# Patient Record
Sex: Male | Born: 1970 | Race: Black or African American | Hispanic: No | Marital: Married | Smoking: Never smoker
Health system: Southern US, Community
[De-identification: ages and names within clinical notes are randomized; demographics above are authoritative.]

## PROBLEM LIST (undated history)

## (undated) DIAGNOSIS — I10 Essential (primary) hypertension: Secondary | ICD-10-CM

---

## 2015-10-25 ENCOUNTER — Ambulatory Visit (INDEPENDENT_AMBULATORY_CARE_PROVIDER_SITE_OTHER): Payer: BLUE CROSS/BLUE SHIELD

## 2015-10-25 ENCOUNTER — Ambulatory Visit
Admission: EM | Admit: 2015-10-25 | Discharge: 2015-10-25 | Disposition: A | Discharge status: Home / Self Care | Payer: BLUE CROSS/BLUE SHIELD | Attending: Family Medicine | Admitting: Family Medicine

## 2015-10-25 DIAGNOSIS — T148XXA Other injury of unspecified body region, initial encounter: Secondary | ICD-10-CM

## 2015-10-25 DIAGNOSIS — T148 Other injury of unspecified body region: Secondary | ICD-10-CM

## 2015-10-25 DIAGNOSIS — S40012A Contusion of left shoulder, initial encounter: Secondary | ICD-10-CM

## 2015-10-25 HISTORY — DX: Essential (primary) hypertension: I10

## 2015-10-25 MED ORDER — KETOROLAC TROMETHAMINE 60 MG/2ML IM SOLN
60.0000 mg | Freq: Once | INTRAMUSCULAR | Status: AC
  Administered 2015-10-25: 60 mg via INTRAMUSCULAR

## 2015-10-25 MED ORDER — IBUPROFEN 800 MG PO TABS: 800.0000 mg | ORAL_TABLET | Freq: Three times a day (TID) | ORAL | Status: DC | PRN

## 2015-10-25 MED ORDER — CYCLOBENZAPRINE HCL 10 MG PO TABS: 10.0000 mg | ORAL_TABLET | Freq: Three times a day (TID) | ORAL | Status: DC | PRN
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
## 2015-11-15 ENCOUNTER — Encounter: Payer: Self-pay | Admitting: Emergency Medicine

## 2015-11-15 ENCOUNTER — Ambulatory Visit
Admission: EM | Admit: 2015-11-15 | Discharge: 2015-11-15 | Disposition: A | Discharge status: Home / Self Care | Payer: BLUE CROSS/BLUE SHIELD | Attending: Family Medicine | Admitting: Family Medicine

## 2015-11-15 DIAGNOSIS — S161XXA Strain of muscle, fascia and tendon at neck level, initial encounter: Secondary | ICD-10-CM

## 2015-11-15 MED ORDER — CYCLOBENZAPRINE HCL 10 MG PO TABS: 10.0000 mg | ORAL_TABLET | Freq: Every day | ORAL | Status: DC

## 2015-11-15 MED ORDER — IBUPROFEN 800 MG PO TABS: 800.0000 mg | ORAL_TABLET | Freq: Three times a day (TID) | ORAL | Status: DC

## 2015-11-15 NOTE — ED Provider Notes (Signed)
CSN: [id number]     Arrival date & time 11/15/15  1919 History   None    Chief Complaint  Patient presents with  . Neck Pain  . Back Pain   (Consider location/radiation/quality/duration/timing/severity/associated sxs/prior Treatment) HPI Comments: 45 yo male with a c/o right sided neck/upper back pain that started today (this afternoon). States was feeling fine recently and denies any injuries, falls, trauma, fevers, chills, numbness/tingling.  Patient is a 45 y.o. male presenting with neck pain and back pain. The history is provided by the patient.  Neck Pain Pain location:  R side Back Pain   Past Medical History  Diagnosis Date  . Hypertension    History reviewed. No pertinent past surgical history. Family History  Problem Relation Age of Onset  . Cancer Father    Social History  Substance Use Topics  . Smoking status: Never Smoker   . Smokeless tobacco: None  . Alcohol Use: Yes     Comment: socially    Review of Systems  Musculoskeletal: Positive for back pain and neck pain.    Allergies  Review of patient's allergies indicates no known allergies.  Home Medications   Prior to Admission medications   Medication Sig Start Date End Date Taking? Authorizing Provider  cyclobenzaprine (FLEXERIL) 10 MG tablet Take 1 tablet (10 mg total) by mouth at bedtime. 11/15/15   Payton Mccallum, MD  ibuprofen (ADVIL,MOTRIN) 800 MG tablet Take 1 tablet (800 mg total) by mouth 3 (three) times daily. 11/15/15   Payton Mccallum, MD   Meds Ordered and Administered this Visit  Medications - No data to display  BP 143/95 mmHg  Pulse 73  Temp(Src) 98.1 F (36.7 C) (Oral)  Resp 16  Ht  (1.702 m)  Wt 198 lb (89.812 kg)  BMI 31.00 kg/m2  SpO2 100% No data found.   Physical Exam  Constitutional: He appears well-developed and well-nourished. No distress.  Musculoskeletal: He exhibits no edema.       Cervical back: He exhibits tenderness and spasm. He exhibits normal range of  motion, no bony tenderness, no swelling, no edema, no deformity, no laceration and normal pulse.       Back:  Tenderness to palpation over the right trapezius muscle  Skin: He is not diaphoretic.  Nursing note and vitals reviewed.   ED Course  Procedures (including critical care time)  Labs Review Labs Reviewed - No data to display  Imaging Review No results found.   Visual Acuity Review  Right Eye Distance:   Left Eye Distance:   Bilateral Distance:    Right Eye Near:   Left Eye Near:    Bilateral Near:         MDM   1. Neck strain, initial encounter    Discharge Medication List as of 11/15/2015  8:27 PM     1. diagnosis reviewed with patient 2. rx as per orders above; reviewed possible side effects, interactions, risks and benefits; rx given for flexeril and ibuprofen as per orders 3. Recommend supportive treatment with heat, gentle range of motion exercises  4. Follow-up prn if symptoms worsen or don't improve    Payton Mccallum, MD 11/15/15 2115

## 2015-11-15 NOTE — ED Notes (Signed)
Patient c/o ongoing right sided neck and upper back pain that has gotten worse today.  Patient denies N/V.  Patient denies fevers.

## 2015-12-18 ENCOUNTER — Other Ambulatory Visit: Payer: Self-pay | Admitting: Family Medicine

## 2015-12-18 DIAGNOSIS — N5089 Other specified disorders of the male genital organs: Secondary | ICD-10-CM

## 2015-12-24 ENCOUNTER — Ambulatory Visit
Admission: RE | Admit: 2015-12-24 | Discharge: 2015-12-24 | Disposition: A | Discharge status: Home / Self Care | Payer: BLUE CROSS/BLUE SHIELD | Source: Ambulatory Visit | Attending: Family Medicine | Admitting: Family Medicine

## 2015-12-24 ENCOUNTER — Ambulatory Visit: Payer: BLUE CROSS/BLUE SHIELD

## 2015-12-24 DIAGNOSIS — N509 Disorder of male genital organs, unspecified: Secondary | ICD-10-CM | POA: Diagnosis present

## 2015-12-24 DIAGNOSIS — N5089 Other specified disorders of the male genital organs: Secondary | ICD-10-CM

## 2015-12-24 DIAGNOSIS — N433 Hydrocele, unspecified: Secondary | ICD-10-CM | POA: Diagnosis not present

## 2017-03-30 ENCOUNTER — Ambulatory Visit
Admission: EM | Admit: 2017-03-30 | Discharge: 2017-03-30 | Disposition: A | Discharge status: Home / Self Care | Payer: BLUE CROSS/BLUE SHIELD | Attending: Family Medicine | Admitting: Family Medicine

## 2017-03-30 DIAGNOSIS — H578 Other specified disorders of eye and adnexa: Secondary | ICD-10-CM | POA: Diagnosis not present

## 2017-03-30 DIAGNOSIS — R51 Headache: Secondary | ICD-10-CM

## 2017-03-30 DIAGNOSIS — H5789 Other specified disorders of eye and adnexa: Secondary | ICD-10-CM

## 2017-03-30 DIAGNOSIS — R519 Headache, unspecified: Secondary | ICD-10-CM

## 2017-03-30 MED ORDER — NAPROXEN 500 MG PO TABS: 500.0000 mg | ORAL_TABLET | Freq: Two times a day (BID) | ORAL | 0 refills | Status: AC

## 2017-03-30 MED ORDER — KETOTIFEN FUMARATE 0.025 % OP SOLN: 1.0000 [drp] | Freq: Two times a day (BID) | OPHTHALMIC | 0 refills | Status: AC

## 2017-03-30 MED ORDER — KETOROLAC TROMETHAMINE 60 MG/2ML IM SOLN
60.0000 mg | Freq: Once | INTRAMUSCULAR | Status: AC
  Administered 2017-03-30: 60 mg via INTRAMUSCULAR

## 2017-03-30 NOTE — ED Triage Notes (Signed)
Pt c/o headache, and pain across his forehead and there are a few raised red bumps there. He also c/o weakness and tightness on the left side. His strength is poor.

## 2017-03-30 NOTE — ED Provider Notes (Signed)
CSN: [id number]     Arrival date & time 03/30/17  1254 History   None    Chief Complaint  Patient presents with  . Headache   (Consider location/radiation/quality/duration/timing/severity/associated sxs/prior Treatment) HPI  This a 46 year old male who presents with a headache and he feels across his forehead and noticed a few raised red bumps there is well. With further questioning he states that the bumps have been there for quite some time and are not new. Pain will radiate from his forehead around his right eye socket laterally. He has noticed some blurring of vision and also some clear discharge from his eye during the day. He thought he had an eyelash in his eye earlier today but that has since subsided. He states that if he lay down this morning to rest -it did improve but got worse after he sat up and was an 8 out of 10 when he got here but has since subsided down to a 4-5 at the present time. He has a cystic structure over the left eye on his forehead which he states had been biopsied in the past thinking it may have been precancerous was actually benign. The other bumps over his right eye almost the same position but not nearly as cystic it has been there for some time. He's had mild nausea but no vomiting today.      Past Medical History:  Diagnosis Date  . Hypertension    History reviewed. No pertinent surgical history. Family History  Problem Relation Age of Onset  . Cancer Father   . Heart failure Sister    Social History  Substance Use Topics  . Smoking status: Never Smoker  . Smokeless tobacco: Never Used  . Alcohol use Yes     Comment: socially    Review of Systems  Constitutional: Positive for activity change. Negative for chills, fatigue and fever.  Eyes: Positive for photophobia and discharge.  Neurological: Positive for headaches.  All other systems reviewed and are negative.   Allergies  Patient has no known allergies.  Home Medications   Prior to  Admission medications   Medication Sig Start Date End Date Taking? Authorizing Provider  acetaminophen (TYLENOL) 325 MG tablet Take 650 mg by mouth every 6 (six) hours as needed.   Yes [provider]  ketotifen (ZADITOR) 0.025 % ophthalmic solution Place 1 drop into both eyes 2 (two) times daily. 03/30/17   Lutricia Feiloemer, [place] Throckmorton P, PA-C  naproxen (NAPROSYN) 500 MG tablet Take 1 tablet (500 mg total) by mouth 2 (two) times daily with a meal. 03/30/17   Lutricia Feiloemer, Pearle Wandler P, PA-C   Meds Ordered and Administered this Visit   Medications  ketorolac (TORADOL) injection 60 mg (60 mg Intramuscular Given 03/30/17 1508)    BP (!) 143/81 (BP Location: Left Arm)   Pulse 80   Temp 99.2 F (37.3 C) (Oral)   Resp 18   Ht 5\' 7"  (1.702 m)   Wt 198 lb (89.8 kg)   SpO2 99%   BMI 31.01 kg/m  No data found.   Physical Exam  Constitutional: He is oriented to person, place, and time. He appears well-developed and well-nourished. No distress.  HENT:  Head: Normocephalic and atraumatic.  Right Ear: External ear normal.  Left Ear: External ear normal.  Eyes: EOM are normal. Pupils are equal, round, and reactive to light. Right eye exhibits discharge. Left eye exhibits no discharge.  Neck: Normal range of motion. Neck supple.  Musculoskeletal: Normal range of motion.  Neurological: He is alert and oriented to person, place, and time.  Skin: Skin is warm and dry. He is not diaphoretic.  Palpation of the forehead does not show any significant tenderness. There is no burning pain present. The spot over his right eye has very small vesicular type of lesions around it but does not appear to be zoster.  Psychiatric: He has a normal mood and affect. His behavior is normal. Judgment and thought content normal.  Nursing note and vitals reviewed.   Urgent Care Course     Procedures (including critical care time)  Labs Review Labs Reviewed - No data to display  Imaging Review No results found.   Visual  Acuity Review  Right Eye Distance: 20/30 Left Eye Distance: 20/30 Bilateral Distance: 20/20  Right Eye Near:   Left Eye Near:    Bilateral Near:     Medications  ketorolac (TORADOL) injection 60 mg (60 mg Intramuscular Given 03/30/17 1508)   Patient pain level dropped from an 8 to a 3 after the Toradol injection.   MDM   1. Acute nonintractable headache, unspecified headache type   2. Irritation of right eye    Discharge Medication List as of 03/30/2017  4:57 PM    START taking these medications   Details  ketotifen (ZADITOR) 0.025 % ophthalmic solution Place 1 drop into both eyes 2 (two) times daily., Starting Tue 03/30/2017, Normal    naproxen (NAPROSYN) 500 MG tablet Take 1 tablet (500 mg total) by mouth 2 (two) times daily with a meal., Starting Tue 03/30/2017, Normal      Plan: 1. Test/x-ray results and diagnosis reviewed with patient 2. rx as per orders; risks, benefits, potential side effects reviewed with patient 3. Recommend supportive treatment withUse of drops for comfort of the right eye. May also use cool compresses. May have been from an  eyelash that he had entrapped but is not present at the current time. His had a decreased from an 8 out of 10 to a 3 out of 10 after the Toradol injection. The rash-like lesion over his right eye is not thought to represent shingles due to him insisting that has been is in for a long time and has not significantly changed. Is not improving she should follow-up with his primary care physician. 4. F/u prn if symptoms worsen or don't improve     Lutricia Feil, PA-C 03/30/17 1710
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
## 2020-03-20 ENCOUNTER — Other Ambulatory Visit: Payer: Self-pay

## 2020-03-20 ENCOUNTER — Emergency Department
Admission: EM | Admit: 2020-03-20 | Discharge: 2020-03-20 | Disposition: A | Discharge status: Home / Self Care | Payer: BLUE CROSS/BLUE SHIELD | Attending: Student in an Organized Health Care Education/Training Program | Admitting: Student in an Organized Health Care Education/Training Program

## 2020-03-20 ENCOUNTER — Encounter: Payer: Self-pay | Admitting: Emergency Medicine

## 2020-03-20 ENCOUNTER — Emergency Department: Payer: BLUE CROSS/BLUE SHIELD

## 2020-03-20 DIAGNOSIS — R0789 Other chest pain: Secondary | ICD-10-CM | POA: Diagnosis not present

## 2020-03-20 DIAGNOSIS — F439 Reaction to severe stress, unspecified: Secondary | ICD-10-CM

## 2020-03-20 DIAGNOSIS — I10 Essential (primary) hypertension: Secondary | ICD-10-CM | POA: Diagnosis not present

## 2020-03-20 DIAGNOSIS — Z79899 Other long term (current) drug therapy: Secondary | ICD-10-CM | POA: Insufficient documentation

## 2020-03-20 DIAGNOSIS — F419 Anxiety disorder, unspecified: Secondary | ICD-10-CM | POA: Diagnosis present

## 2020-03-20 LAB — COMPREHENSIVE METABOLIC PANEL
ALT: 19 U/L (ref 0–44)
AST: 18 U/L (ref 15–41)
Albumin: 4.6 g/dL (ref 3.5–5.0)
Alkaline Phosphatase: 44 U/L (ref 38–126)
Anion gap: 10 (ref 5–15)
BUN: 10 mg/dL (ref 6–20)
CO2: 26 mmol/L (ref 22–32)
Calcium: 8.9 mg/dL (ref 8.9–10.3)
Chloride: 101 mmol/L (ref 98–111)
Creatinine, Ser: 1 mg/dL (ref 0.61–1.24)
GFR calc Af Amer: >60 mL/min (ref >60)
GFR calc non Af Amer: >60 mL/min (ref >60)
Glucose, Bld: 135 mg/dL — ABNORMAL HIGH (ref 70–99)
Potassium: 3.5 mmol/L (ref 3.5–5.1)
Sodium: 137 mmol/L (ref 135–145)
Total Bilirubin: 1 mg/dL (ref 0.3–1.2)
Total Protein: 8.5 g/dL — ABNORMAL HIGH (ref 6.5–8.1)

## 2020-03-20 LAB — CBC WITH DIFFERENTIAL/PLATELET
Abs Immature Granulocytes: 0.01 10*3/uL (ref 0.00–0.07)
Basophils Absolute: 0 10*3/uL (ref 0.0–0.1)
Basophils Relative: 0 %
Eosinophils Absolute: 0 10*3/uL (ref 0.0–0.5)
Eosinophils Relative: 0 %
HCT: 46 % (ref 39.0–52.0)
Hemoglobin: 16 g/dL (ref 13.0–17.0)
Immature Granulocytes: 0 %
Lymphocytes Relative: 25 %
Lymphs Abs: 1.4 10*3/uL (ref 0.7–4.0)
MCH: 30.7 pg (ref 26.0–34.0)
MCHC: 34.8 g/dL (ref 30.0–36.0)
MCV: 88.1 fL (ref 80.0–100.0)
Monocytes Absolute: 0.5 10*3/uL (ref 0.1–1.0)
Monocytes Relative: 8 %
Neutro Abs: 3.7 10*3/uL (ref 1.7–7.7)
Neutrophils Relative %: 67 %
Platelets: 283 10*3/uL (ref 150–400)
RBC: 5.22 MIL/uL (ref 4.22–5.81)
RDW: 13.2 % (ref 11.5–15.5)
WBC: 5.6 10*3/uL (ref 4.0–10.5)
nRBC: 0 % (ref 0.0–0.2)

## 2020-03-20 LAB — TROPONIN I (HIGH SENSITIVITY)
Troponin I (High Sensitivity): 4 ng/L (ref <18)
Troponin I (High Sensitivity): 4 ng/L (ref <18)

## 2020-03-20 MED ORDER — AMLODIPINE BESYLATE 5 MG PO TABS: 5.0000 mg | ORAL_TABLET | Freq: Every day | ORAL | 0 refills | Status: AC

## 2020-03-20 MED ORDER — ALPRAZOLAM 0.5 MG PO TABS: 0.5000 mg | ORAL_TABLET | Freq: Three times a day (TID) | ORAL | 0 refills | Status: AC | PRN

## 2020-03-20 NOTE — ED Triage Notes (Addendum)
Patient ambulatory to triage with steady gait, without difficulty, mask in place; pt reports he was seen by his PCP yesterday but did not receive his lab testing; st he has been on the computer researching medical problems & believes that he has HIV or lymes because his "muscles are jumping in left arm and into his chest accomp by sweating"; pt appears very anxious; per pt's chart, he was seen by his PCP yesterday for same and was dx with anxiety disorder

## 2020-03-20 NOTE — ED Notes (Signed)
Pt returns to STAT desk--pt apologizes, as he has not read his PCP's discharge instructions from yesterday regarding anxiety; pt now feels that this explains all of his symptoms; pt however does wish to remain here and talk further with an ED provider regarding such

## 2020-03-20 NOTE — ED Notes (Signed)
Patient report woke up having a panic attack, states he thought he was going to die.currently denies and cp/sob, ne to eval and plan further care.

## 2020-03-20 NOTE — ED Notes (Signed)
Patient discharged home, patient received discharge papers. Patient appropriate and cooperative, Vital signs taken. NAD noted.

## 2020-03-20 NOTE — ED Provider Notes (Signed)
[facility] Emergency Department Provider Note    First MD Initiated Contact with Patient 03/20/20 0725     (approximate)  I have reviewed the triage vital signs and the nursing notes.   HISTORY  Chief Complaint Chest Pain    HPI Cameron Robinson is a 49 y.o. male   presents to the ER for evaluation of significant stressors at home.  States that he has been feeling generalized anxiety for several days to weeks.  States that he was recently diagnosed with by his PCP and started on paroxetine but is only taken 1 pill.  States that a number of stressors at home include financial stressors, dissatisfaction at work, family stressors including going through separation with his current wife and frustrations with children moving back home instead of going to complete school even though they are on a scholarship.  He denies any SI or HI and states that he wants to be here for his children and family but does feel like stressors including some family losses from Covid are becoming difficult to manage.  Denies any chest pain or pressure.  No shortness of breath.  Did feel sweaty during a couple of these episodes of the past several days.  Denies any nausea or vomiting.  No lip swelling.  States he also has a history of high blood pressure and was previously on antihypertensive medication but weaned himself off and does not recall which medication that is.   Past Medical History:  Diagnosis Date  . Hypertension    Family History  Problem Relation Age of Onset  . Cancer Father   . Heart failure Sister    History reviewed. No pertinent surgical history. There are no problems to display for this patient.     Prior to Admission medications   Medication Sig Start Date End Date Taking? Authorizing Provider  acetaminophen (TYLENOL) 325 MG tablet Take 650 mg by mouth every 6 (six) hours as needed.    [provider]  ALPRAZolam Prudy Feeler) 0.5 MG tablet Take 1 tablet (0.5  mg total) by mouth every 8 (eight) hours as needed for anxiety or sleep. 03/20/20 03/20/21  Willy Eddy, MD  amLODipine (NORVASC) 5 MG tablet Take 1 tablet (5 mg total) by mouth daily. 03/20/20 03/20/21  Willy Eddy, MD  ketotifen (ZADITOR) 0.025 % ophthalmic solution Place 1 drop into both eyes 2 (two) times daily. 03/30/17   Lutricia Feil, PA-C  naproxen (NAPROSYN) 500 MG tablet Take 1 tablet (500 mg total) by mouth 2 (two) times daily with a meal. 03/30/17   Lutricia Feil, PA-C    Allergies Patient has no known allergies.    Social History Social History   Tobacco Use  . Smoking status: Never Smoker  . Smokeless tobacco: Never Used  Substance Use Topics  . Alcohol use: Yes    Comment: socially  . Drug use: Not on file    Review of Systems Patient denies headaches, rhinorrhea, blurry vision, numbness, shortness of breath, chest pain, edema, cough, abdominal pain, nausea, vomiting, diarrhea, dysuria, fevers, rashes or hallucinations unless otherwise stated above in HPI. ____________________________________________   PHYSICAL EXAM:  VITAL SIGNS: Vitals:   03/20/20 0621 03/20/20 0744  BP: (!) 170/101   Pulse: 82   Resp: 18   Temp:    SpO2: 99% 100%    Constitutional: Alert and oriented.  Eyes: Conjunctivae are normal.  Head: Atraumatic. Nose: No congestion/rhinnorhea. Mouth/Throat: Mucous membranes are moist.   Neck: No stridor.  Painless ROM.  Cardiovascular: Normal rate, regular rhythm. Grossly normal heart sounds.  Good peripheral circulation. Respiratory: Normal respiratory effort.  No retractions. Lungs CTAB. Gastrointestinal: Soft and nontender. No distention. No abdominal bruits. No CVA tenderness. Genitourinary:  Musculoskeletal: No lower extremity tenderness nor edema.  No joint effusions. Neurologic:  Normal speech and language. No gross focal neurologic deficits are appreciated. No facial droop Skin:  Skin is warm, dry and intact. No rash  noted. Psychiatric: Mood is anxious and stress, affect is normal. Speech and behavior are normal.  Good insight, no HI or SI  ____________________________________________   LABS (all labs ordered are listed, but only abnormal results are displayed)  Results for orders placed or performed during the hospital encounter of 03/20/20 (from the past 24 hour(s))  CBC with Differential     Status: None   Collection Time: 03/20/20  3:47 AM  Result Value Ref Range   WBC 5.6 4.0 - 10.5 K/uL   RBC 5.22 4.22 - 5.81 MIL/uL   Hemoglobin 16.0 13.0 - 17.0 g/dL   HCT 46.0 39.0 - 52.0 %   MCV 88.1 80.0 - 100.0 fL   MCH 30.7 26.0 - 34.0 pg   MCHC 34.8 30.0 - 36.0 g/dL   RDW 13.2 11.5 - 15.5 %   Platelets 283 150 - 400 K/uL   nRBC 0.0 0.0 - 0.2 %   Neutrophils Relative % 67 %   Neutro Abs 3.7 1.7 - 7.7 K/uL   Lymphocytes Relative 25 %   Lymphs Abs 1.4 0.7 - 4.0 K/uL   Monocytes Relative 8 %   Monocytes Absolute 0.5 0.1 - 1.0 K/uL   Eosinophils Relative 0 %   Eosinophils Absolute 0.0 0.0 - 0.5 K/uL   Basophils Relative 0 %   Basophils Absolute 0.0 0.0 - 0.1 K/uL   Immature Granulocytes 0 %   Abs Immature Granulocytes 0.01 0.00 - 0.07 K/uL  Comprehensive metabolic panel     Status: Abnormal   Collection Time: 03/20/20  3:47 AM  Result Value Ref Range   Sodium 137 135 - 145 mmol/L   Potassium 3.5 3.5 - 5.1 mmol/L   Chloride 101 98 - 111 mmol/L   CO2 26 22 - 32 mmol/L   Glucose, Bld 135 (H) 70 - 99 mg/dL   BUN 10 6 - 20 mg/dL   Creatinine, Ser 1.00 0.61 - 1.24 mg/dL   Calcium 8.9 8.9 - 10.3 mg/dL   Total Protein 8.5 (H) 6.5 - 8.1 g/dL   Albumin 4.6 3.5 - 5.0 g/dL   AST 18 15 - 41 U/L   ALT 19 0 - 44 U/L   Alkaline Phosphatase 44 38 - 126 U/L   Total Bilirubin 1.0 0.3 - 1.2 mg/dL   GFR calc non Af Amer >60 >60 mL/min   GFR calc Af Amer >60 >60 mL/min   Anion gap 10 5 - 15  Troponin I (High Sensitivity)     Status: None   Collection Time: 03/20/20  3:47 AM  Result Value Ref Range    Troponin I (High Sensitivity) 4 <18 ng/L  Troponin I (High Sensitivity)     Status: None   Collection Time: 03/20/20  6:24 AM  Result Value Ref Range   Troponin I (High Sensitivity) 4 <18 ng/L   ____________________________________________  EKG My review and personal interpretation at Time: 3:49   Indication: htn  Rate: 100  Rhythm: sinus Axis: normal Other: normal intervals, no stemi ____________________________________________  RADIOLOGY  I personally  reviewed all radiographic images ordered to evaluate for the above acute complaints and reviewed radiology reports and findings.  These findings were personally discussed with the patient.  Please see medical record for radiology report.  ____________________________________________   PROCEDURES  Procedure(s) performed:  Procedures    Critical Care performed: no ____________________________________________   INITIAL IMPRESSION / ASSESSMENT AND PLAN / ED COURSE  Pertinent labs & imaging results that were available during my care of the patient were reviewed by me and considered in my medical decision making (see chart for details).   DDX: Hypertension, ACS, pericarditis, pneumonia, pneumothorax, PE, dissection, adjustment disorder, stress, panic, depression  Inniss Federici is a 49 y.o. who presents to the ED with symptoms as described above.  His EKG is nonischemic and he has 2 high-sensitivity troponins that are negative.  He is hypertensive but also endorses feeling very anxious and significant stressors at home.  I suspect that his presentation is related to generalized anxiety disorder with intermittent panic.  Does not seem consistent with PE or dissection given lack of symptoms at this time no pain radiating through to his back, neuro deficits, pleuritic chest pain or complaint of shortness of breath or hypoxia.  His abdominal exam is otherwise benign.  Patient denies any SI or HI.  I do not feel that he requires  hospitalization for psychiatric evaluation at this time.  Do believe he would benefit from short course of as needed anxiolysis and I will prescribe a few Xanax which he agrees to.  Based on his high blood pressure and history of such we will also ordered Norvasc and have instructed the patient to return to PCP for further blood pressure management and monitoring.  Patient agreeable to plan.  Have discussed with the patient and available family all diagnostics and treatments performed thus far and all questions were answered to the best of my ability. The patient demonstrates understanding and agreement with plan.      The patient was evaluated in Emergency Department today for the symptoms described in the history of present illness. He/she was evaluated in the context of the global COVID-19 pandemic, which necessitated consideration that the patient might be at risk for infection with the SARS-CoV-2 virus that causes COVID-19. Institutional protocols and algorithms that pertain to the evaluation of patients at risk for COVID-19 are in a state of rapid change based on information released by regulatory bodies including the CDC and federal and state organizations. These policies and algorithms were followed during the patient's care in the ED.  As part of my medical decision making, I reviewed the following data within the electronic MEDICAL RECORD NUMBER Nursing notes reviewed and incorporated, Labs reviewed, notes from prior ED visits and [place] Controlled Substance Database   ____________________________________________   FINAL CLINICAL IMPRESSION(S) / ED DIAGNOSES  Final diagnoses:  Hypertension, unspecified type  Stress      NEW MEDICATIONS STARTED DURING THIS VISIT:  New Prescriptions   ALPRAZOLAM (XANAX) 0.5 MG TABLET    Take 1 tablet (0.5 mg total) by mouth every 8 (eight) hours as needed for anxiety or sleep.   AMLODIPINE (NORVASC) 5 MG TABLET    Take 1 tablet (5 mg total) by mouth daily.      Note:  This document was prepared using Dragon voice recognition software and may include unintentional dictation errors.    Willy Eddy, MD 03/20/20 [phone]

## 2021-10-05 IMAGING — CR DG CHEST 2V
1 series · 2 of 2 positions shown · non-contrast
Comparison: None.

CLINICAL DATA: Chest pain

EXAM:
CHEST - 2 VIEW

[Series 1: dg chest 2 view · 0.14mm/px · 2 of 2 slices shown]
[im 1/2]
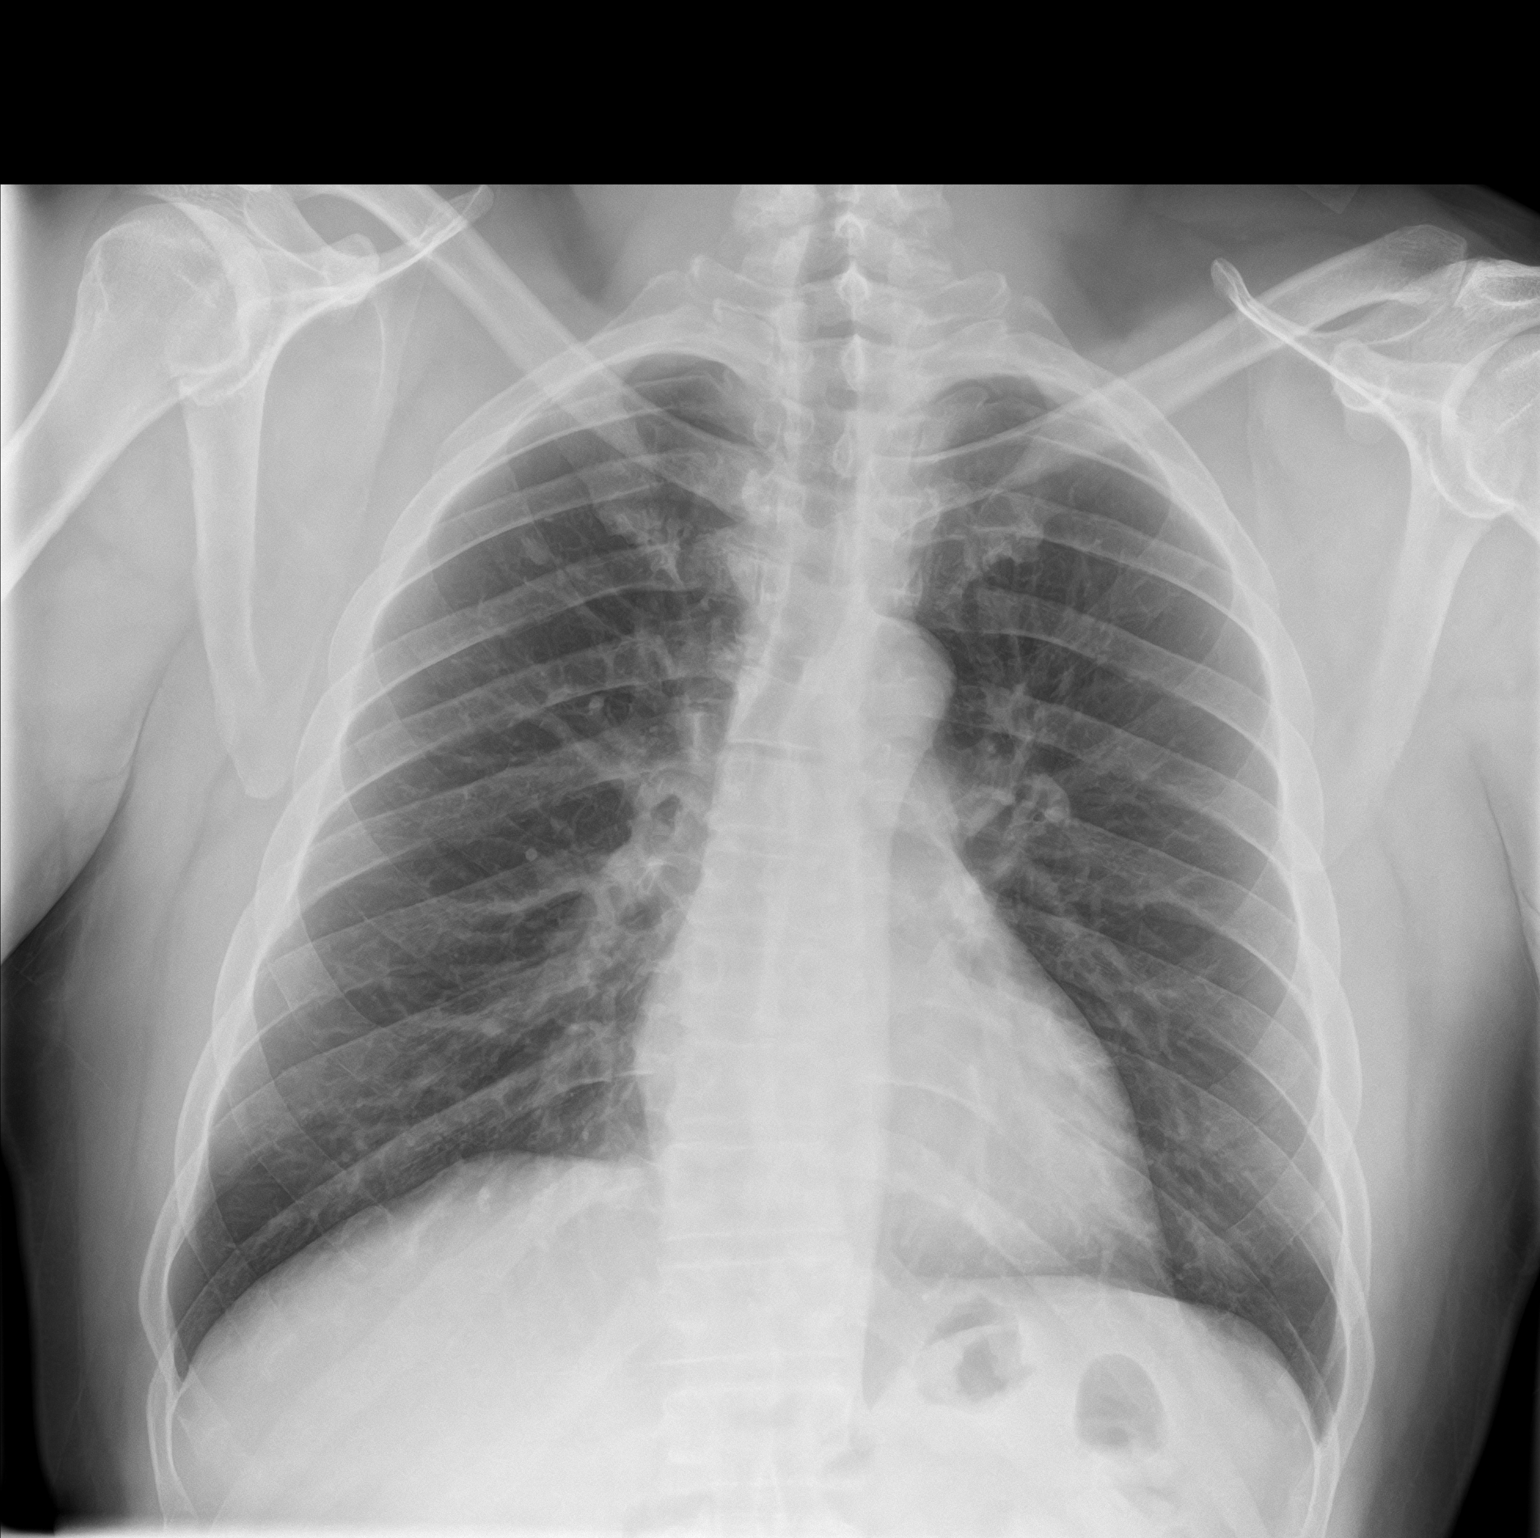
[im 2/2]
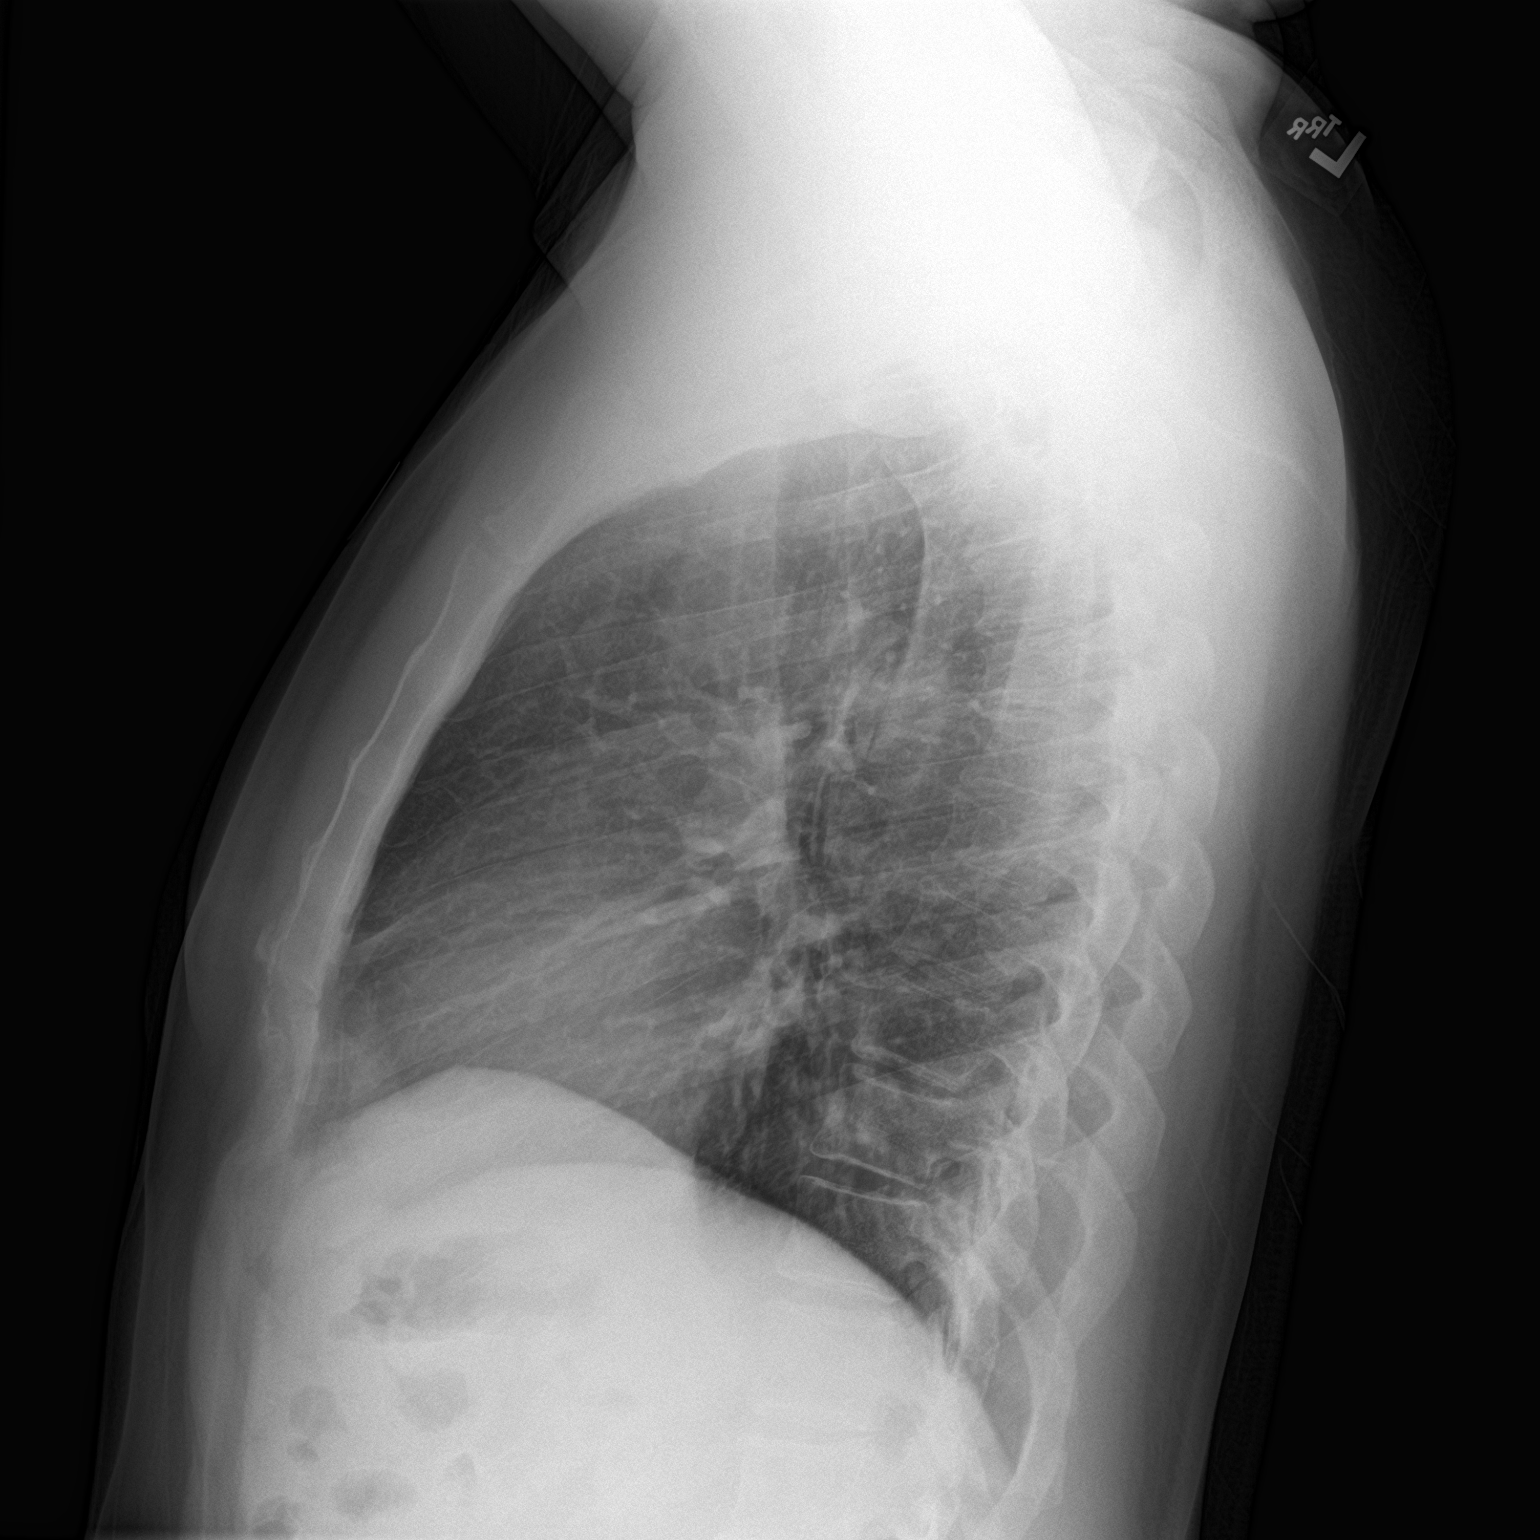

[2 of 2 positions shown; findings below may reference images not displayed]

FINDINGS: Normal heart size and mediastinal contours. 6 mm nodular density
overlapping the anterior right second rib. No acute infiltrate or
edema. No effusion or pneumothorax. No acute osseous findings.
IMPRESSION: 1. No active cardiopulmonary disease.
2. 6 mm nodular density overlapping the anterior right second rib,
favor vascular structure or other benign entity. Recommend
outpatient follow-up for repeat chest x-ray to evaluate for
persistence.
